# Patient Record
Sex: Female | Born: 1935 | Race: White | Hispanic: No
Health system: Southern US, Community
[De-identification: ages and names within clinical notes are randomized; demographics above are authoritative.]

---

## 2003-11-16 ENCOUNTER — Ambulatory Visit: Payer: Self-pay | Admitting: Gastroenterology

## 2004-10-17 ENCOUNTER — Ambulatory Visit: Payer: Self-pay | Admitting: Internal Medicine

## 2005-11-08 ENCOUNTER — Ambulatory Visit: Payer: Self-pay | Admitting: Internal Medicine

## 2006-10-08 ENCOUNTER — Other Ambulatory Visit: Payer: Self-pay

## 2006-10-08 ENCOUNTER — Ambulatory Visit: Payer: Self-pay | Admitting: Unknown Physician Specialty

## 2006-10-22 ENCOUNTER — Ambulatory Visit: Payer: Self-pay | Admitting: Unknown Physician Specialty

## 2006-11-12 ENCOUNTER — Ambulatory Visit: Payer: Self-pay | Admitting: Internal Medicine

## 2006-11-19 ENCOUNTER — Ambulatory Visit: Payer: Self-pay | Admitting: Internal Medicine

## 2007-12-09 ENCOUNTER — Ambulatory Visit: Payer: Self-pay | Admitting: Internal Medicine

## 2008-01-15 ENCOUNTER — Ambulatory Visit: Payer: Self-pay | Admitting: Internal Medicine

## 2008-02-10 ENCOUNTER — Ambulatory Visit: Payer: Self-pay | Admitting: Internal Medicine

## 2008-04-08 ENCOUNTER — Ambulatory Visit: Payer: Self-pay | Admitting: Specialist

## 2008-08-10 ENCOUNTER — Ambulatory Visit: Payer: Self-pay | Admitting: Internal Medicine

## 2008-09-16 ENCOUNTER — Encounter: Payer: Self-pay | Admitting: Neurology

## 2008-10-13 ENCOUNTER — Encounter: Payer: Self-pay | Admitting: Neurology

## 2008-11-12 ENCOUNTER — Encounter: Payer: Self-pay | Admitting: Neurology

## 2008-12-13 ENCOUNTER — Encounter: Payer: Self-pay | Admitting: Neurology

## 2009-01-17 ENCOUNTER — Ambulatory Visit: Payer: Self-pay | Admitting: Internal Medicine

## 2009-01-19 ENCOUNTER — Encounter: Payer: Self-pay | Admitting: Neurology

## 2009-02-12 ENCOUNTER — Encounter: Payer: Self-pay | Admitting: Neurology

## 2009-03-15 ENCOUNTER — Encounter: Payer: Self-pay | Admitting: Neurology

## 2009-04-12 ENCOUNTER — Encounter: Payer: Self-pay | Admitting: Neurology

## 2009-05-13 ENCOUNTER — Encounter: Payer: Self-pay | Admitting: Neurology

## 2009-06-12 ENCOUNTER — Encounter: Payer: Self-pay | Admitting: Neurology

## 2009-06-17 ENCOUNTER — Emergency Department: Payer: Self-pay | Admitting: Emergency Medicine

## 2010-01-18 ENCOUNTER — Ambulatory Visit: Payer: Self-pay | Admitting: Internal Medicine

## 2010-04-21 ENCOUNTER — Ambulatory Visit: Payer: Self-pay | Admitting: Ophthalmology

## 2010-04-21 DIAGNOSIS — I119 Hypertensive heart disease without heart failure: Secondary | ICD-10-CM

## 2010-05-01 ENCOUNTER — Ambulatory Visit: Payer: Self-pay | Admitting: Ophthalmology

## 2011-02-20 ENCOUNTER — Emergency Department: Payer: Self-pay | Admitting: Emergency Medicine

## 2011-07-20 ENCOUNTER — Emergency Department: Payer: Self-pay | Admitting: Emergency Medicine

## 2011-07-20 LAB — URINALYSIS, COMPLETE
Blood: NEGATIVE
Glucose,UR: NEGATIVE mg/dL (ref 0–75)
Leukocyte Esterase: NEGATIVE
RBC,UR: 1 /HPF (ref 0–5)
Squamous Epithelial: <1
WBC UR: 1 /HPF (ref 0–5)

## 2011-07-20 LAB — COMPREHENSIVE METABOLIC PANEL
Albumin: 3.9 g/dL (ref 3.4–5.0)
Alkaline Phosphatase: 73 U/L (ref 50–136)
Anion Gap: 9 (ref 7–16)
BUN: 11 mg/dL (ref 7–18)
Bilirubin,Total: 0.7 mg/dL (ref 0.2–1.0)
Calcium, Total: 8.9 mg/dL (ref 8.5–10.1)
Chloride: 108 mmol/L — ABNORMAL HIGH (ref 98–107)
Co2: 25 mmol/L (ref 21–32)
Creatinine: 0.73 mg/dL (ref 0.60–1.30)
EGFR (African American): >60
Glucose: 101 mg/dL — ABNORMAL HIGH (ref 65–99)
Osmolality: 283 (ref 275–301)
Potassium: 3.6 mmol/L (ref 3.5–5.1)
SGOT(AST): 78 U/L — ABNORMAL HIGH (ref 15–37)
SGPT (ALT): 91 U/L — ABNORMAL HIGH
Sodium: 142 mmol/L (ref 136–145)
Total Protein: 7.9 g/dL (ref 6.4–8.2)

## 2011-07-20 LAB — CBC
HCT: 37.6 % (ref 35.0–47.0)
MCH: 35 pg — ABNORMAL HIGH (ref 26.0–34.0)
MCHC: 33.6 g/dL (ref 32.0–36.0)
MCV: 104 fL — ABNORMAL HIGH (ref 80–100)
Platelet: 204 10*3/uL (ref 150–440)
WBC: 7.7 10*3/uL (ref 3.6–11.0)

## 2011-07-20 LAB — DRUG SCREEN, URINE
Barbiturates, Ur Screen: NEGATIVE (ref ?–200)
Cannabinoid 50 Ng, Ur ~~LOC~~: NEGATIVE (ref ?–50)
Cocaine Metabolite,Ur ~~LOC~~: NEGATIVE (ref ?–300)
MDMA (Ecstasy)Ur Screen: NEGATIVE (ref ?–500)
Methadone, Ur Screen: NEGATIVE (ref ?–300)
Phencyclidine (PCP) Ur S: NEGATIVE (ref ?–25)

## 2011-07-23 ENCOUNTER — Encounter: Payer: Self-pay | Admitting: Internal Medicine

## 2011-08-13 ENCOUNTER — Encounter: Payer: Self-pay | Admitting: Internal Medicine

## 2012-04-24 ENCOUNTER — Encounter: Payer: Self-pay | Admitting: Internal Medicine

## 2012-06-27 ENCOUNTER — Ambulatory Visit: Payer: Self-pay

## 2012-08-12 ENCOUNTER — Ambulatory Visit: Payer: Self-pay | Admitting: Internal Medicine

## 2012-09-01 ENCOUNTER — Inpatient Hospital Stay: Payer: Self-pay | Admitting: Internal Medicine

## 2012-09-01 LAB — COMPREHENSIVE METABOLIC PANEL
Albumin: 3 g/dL — ABNORMAL LOW (ref 3.4–5.0)
Alkaline Phosphatase: 87 U/L (ref 50–136)
Anion Gap: 5 — ABNORMAL LOW (ref 7–16)
Bilirubin,Total: 1 mg/dL (ref 0.2–1.0)
Calcium, Total: 9.2 mg/dL (ref 8.5–10.1)
EGFR (African American): >60
EGFR (Non-African Amer.): >60
Osmolality: 284 (ref 275–301)
Potassium: 4.1 mmol/L (ref 3.5–5.1)
SGOT(AST): 31 U/L (ref 15–37)
SGPT (ALT): 18 U/L (ref 12–78)
Sodium: 140 mmol/L (ref 136–145)

## 2012-09-01 LAB — DRUG SCREEN, URINE
Amphetamines, Ur Screen: NEGATIVE (ref ?–1000)
Barbiturates, Ur Screen: NEGATIVE (ref ?–200)
Benzodiazepine, Ur Scrn: NEGATIVE (ref ?–200)
Methadone, Ur Screen: NEGATIVE (ref ?–300)
Opiate, Ur Screen: NEGATIVE (ref ?–300)
Tricyclic, Ur Screen: NEGATIVE (ref ?–1000)

## 2012-09-01 LAB — CBC
HCT: 34.2 % — ABNORMAL LOW (ref 35.0–47.0)
Platelet: 187 10*3/uL (ref 150–440)
RBC: 3.36 10*6/uL — ABNORMAL LOW (ref 3.80–5.20)
RDW: 12.6 % (ref 11.5–14.5)
WBC: 8.6 10*3/uL (ref 3.6–11.0)

## 2012-09-01 LAB — URINALYSIS, COMPLETE
Glucose,UR: NEGATIVE mg/dL (ref 0–75)
Ph: 6 (ref 4.5–8.0)
RBC,UR: 4 /HPF (ref 0–5)
Specific Gravity: 1.017 (ref 1.003–1.030)
Squamous Epithelial: NONE SEEN
WBC UR: 567 /HPF (ref 0–5)

## 2012-09-02 LAB — CBC WITH DIFFERENTIAL/PLATELET
Basophil %: 0.9 %
Eosinophil %: 2.9 %
MCH: 34.9 pg — ABNORMAL HIGH (ref 26.0–34.0)
MCHC: 34.1 g/dL (ref 32.0–36.0)
MCV: 102 fL — ABNORMAL HIGH (ref 80–100)
Monocyte %: 12.3 %
Neutrophil %: 68.2 %
Platelet: 191 10*3/uL (ref 150–440)
RBC: 3 10*6/uL — ABNORMAL LOW (ref 3.80–5.20)

## 2012-09-02 LAB — BASIC METABOLIC PANEL
Co2: 27 mmol/L (ref 21–32)
EGFR (African American): >60
EGFR (Non-African Amer.): >60
Osmolality: 281 (ref 275–301)
Potassium: 4 mmol/L (ref 3.5–5.1)
Sodium: 140 mmol/L (ref 136–145)

## 2012-09-03 LAB — CBC WITH DIFFERENTIAL/PLATELET
Eosinophil #: 0.3 10*3/uL (ref 0.0–0.7)
HCT: 29.9 % — ABNORMAL LOW (ref 35.0–47.0)
HGB: 10.3 g/dL — ABNORMAL LOW (ref 12.0–16.0)
Lymphocyte #: 1.5 10*3/uL (ref 1.0–3.6)
MCV: 101 fL — ABNORMAL HIGH (ref 80–100)
Monocyte #: 1 x10 3/mm — ABNORMAL HIGH (ref 0.2–0.9)
Monocyte %: 14.5 %
Neutrophil %: 57.7 %
Platelet: 208 10*3/uL (ref 150–440)
WBC: 6.7 10*3/uL (ref 3.6–11.0)

## 2012-09-03 LAB — BASIC METABOLIC PANEL
Anion Gap: 4 — ABNORMAL LOW (ref 7–16)
BUN: 13 mg/dL (ref 7–18)
Chloride: 108 mmol/L — ABNORMAL HIGH (ref 98–107)
EGFR (African American): >60
EGFR (Non-African Amer.): >60
Glucose: 90 mg/dL (ref 65–99)
Osmolality: 281 (ref 275–301)
Potassium: 3.5 mmol/L (ref 3.5–5.1)
Sodium: 141 mmol/L (ref 136–145)

## 2012-09-03 LAB — URINE CULTURE

## 2012-09-04 LAB — BASIC METABOLIC PANEL
Anion Gap: 8 (ref 7–16)
BUN: 10 mg/dL (ref 7–18)
Calcium, Total: 9.2 mg/dL (ref 8.5–10.1)
Creatinine: 0.6 mg/dL (ref 0.60–1.30)
EGFR (Non-African Amer.): >60
Sodium: 139 mmol/L (ref 136–145)

## 2012-09-04 LAB — CBC WITH DIFFERENTIAL/PLATELET
Basophil #: 0.1 10*3/uL (ref 0.0–0.1)
Basophil %: 0.7 %
Eosinophil #: 0.3 10*3/uL (ref 0.0–0.7)
HCT: 31.7 % — ABNORMAL LOW (ref 35.0–47.0)
MCH: 32.5 pg (ref 26.0–34.0)
MCV: 100 fL (ref 80–100)
Monocyte %: 11.5 %
Neutrophil #: 4.4 10*3/uL (ref 1.4–6.5)
Neutrophil %: 60.2 %
Platelet: 244 10*3/uL (ref 150–440)
RBC: 3.17 10*6/uL — ABNORMAL LOW (ref 3.80–5.20)
RDW: 13 % (ref 11.5–14.5)

## 2012-09-05 ENCOUNTER — Encounter: Payer: Self-pay | Admitting: Internal Medicine

## 2012-09-06 LAB — CULTURE, BLOOD (SINGLE)

## 2012-09-12 ENCOUNTER — Encounter: Payer: Self-pay | Admitting: Internal Medicine

## 2012-09-12 ENCOUNTER — Ambulatory Visit: Payer: Self-pay | Admitting: Nurse Practitioner

## 2012-09-23 LAB — URINALYSIS, COMPLETE
Glucose,UR: NEGATIVE mg/dL (ref 0–75)
Ketone: NEGATIVE
Nitrite: POSITIVE
Ph: 5 (ref 4.5–8.0)
RBC,UR: 23 /HPF (ref 0–5)
Specific Gravity: 1.015 (ref 1.003–1.030)
Squamous Epithelial: 2
WBC UR: 924 /HPF (ref 0–5)

## 2012-09-26 LAB — URINE CULTURE

## 2013-06-12 ENCOUNTER — Encounter: Payer: Self-pay | Admitting: Internal Medicine

## 2013-06-12 ENCOUNTER — Ambulatory Visit: Payer: Self-pay | Admitting: Nurse Practitioner

## 2013-06-20 LAB — URINALYSIS, COMPLETE
Bilirubin,UR: NEGATIVE
Glucose,UR: NEGATIVE mg/dL (ref 0–75)
KETONE: NEGATIVE
NITRITE: POSITIVE
PH: 5 (ref 4.5–8.0)
Protein: 100
RBC,UR: 44 /HPF (ref 0–5)
Specific Gravity: 1.018 (ref 1.003–1.030)
Squamous Epithelial: NONE SEEN
WBC UR: 3968 /HPF (ref 0–5)

## 2013-06-22 LAB — URINE CULTURE

## 2013-07-06 LAB — URINALYSIS, COMPLETE: Specific Gravity: 1.018 (ref 1.003–1.030)

## 2013-07-08 LAB — URINE CULTURE

## 2013-07-13 ENCOUNTER — Encounter: Payer: Self-pay | Admitting: Internal Medicine

## 2013-07-13 ENCOUNTER — Ambulatory Visit
Admit: 2013-07-13 | Disposition: A | Discharge status: Home / Self Care | Payer: Self-pay | Attending: Nurse Practitioner | Admitting: Nurse Practitioner

## 2013-08-02 LAB — URINALYSIS, COMPLETE
Bilirubin,UR: NEGATIVE
Glucose,UR: NEGATIVE mg/dL (ref 0–75)
Hyaline Cast: 18
Ketone: NEGATIVE
Nitrite: POSITIVE
Ph: 5 (ref 4.5–8.0)
Protein: 100
RBC,UR: 212 /HPF (ref 0–5)
Specific Gravity: 1.016 (ref 1.003–1.030)
Squamous Epithelial: 4
WBC UR: 3209 /HPF (ref 0–5)

## 2013-08-05 LAB — URINE CULTURE

## 2013-08-12 ENCOUNTER — Encounter: Payer: Self-pay | Admitting: Internal Medicine

## 2013-08-19 LAB — URINALYSIS, COMPLETE
Bilirubin,UR: NEGATIVE
Glucose,UR: NEGATIVE mg/dL (ref 0–75)
KETONE: NEGATIVE
Nitrite: POSITIVE
PH: 6 (ref 4.5–8.0)
Protein: 30
RBC,UR: 87 /HPF (ref 0–5)
Specific Gravity: 1.013 (ref 1.003–1.030)
WBC UR: 1298 /HPF (ref 0–5)

## 2013-08-23 LAB — URINE CULTURE

## 2013-09-12 ENCOUNTER — Encounter: Payer: Self-pay | Admitting: Internal Medicine

## 2013-09-17 LAB — URINALYSIS, COMPLETE
Bilirubin,UR: NEGATIVE
Glucose,UR: NEGATIVE mg/dL (ref 0–75)
KETONE: NEGATIVE
Nitrite: POSITIVE
PH: 8 (ref 4.5–8.0)
RBC,UR: 55 /HPF (ref 0–5)
SPECIFIC GRAVITY: 1.014 (ref 1.003–1.030)
Squamous Epithelial: <1
WBC UR: 498 /HPF (ref 0–5)

## 2013-09-20 LAB — URINE CULTURE

## 2013-10-01 LAB — URINALYSIS, COMPLETE
Bilirubin,UR: NEGATIVE
Blood: NEGATIVE
GLUCOSE, UR: NEGATIVE mg/dL (ref 0–75)
KETONE: NEGATIVE
Nitrite: POSITIVE
Ph: 7 (ref 4.5–8.0)
RBC,UR: 42 /HPF (ref 0–5)
SPECIFIC GRAVITY: 1.021 (ref 1.003–1.030)

## 2013-10-03 LAB — URINE CULTURE

## 2013-10-13 ENCOUNTER — Encounter: Payer: Self-pay | Admitting: Internal Medicine

## 2013-10-23 LAB — URINALYSIS, COMPLETE
Bilirubin,UR: NEGATIVE
GLUCOSE, UR: NEGATIVE mg/dL (ref 0–75)
Ketone: NEGATIVE
NITRITE: POSITIVE
PH: 7 (ref 4.5–8.0)
Specific Gravity: 1.021 (ref 1.003–1.030)
Squamous Epithelial: 2
WBC UR: 1452 /HPF (ref 0–5)

## 2013-10-26 LAB — URINE CULTURE

## 2013-11-12 ENCOUNTER — Encounter: Payer: Self-pay | Admitting: Internal Medicine

## 2013-11-16 LAB — URINALYSIS, COMPLETE
BILIRUBIN, UR: NEGATIVE
BLOOD: NEGATIVE
Bacteria: NONE SEEN
Glucose,UR: NEGATIVE mg/dL (ref 0–75)
KETONE: NEGATIVE
Nitrite: POSITIVE
PH: 6 (ref 4.5–8.0)
PROTEIN: NEGATIVE
Specific Gravity: 1.015 (ref 1.003–1.030)
Squamous Epithelial: 1

## 2013-11-18 LAB — URINE CULTURE

## 2013-12-13 ENCOUNTER — Encounter: Payer: Self-pay | Admitting: Internal Medicine

## 2014-01-12 ENCOUNTER — Encounter: Payer: Self-pay | Admitting: Internal Medicine

## 2014-02-12 ENCOUNTER — Encounter: Payer: Self-pay | Admitting: Internal Medicine

## 2014-03-15 ENCOUNTER — Encounter: Payer: Self-pay | Admitting: Internal Medicine

## 2014-04-13 ENCOUNTER — Encounter
Admit: 2014-04-13 | Disposition: A | Discharge status: Home / Self Care | Payer: Self-pay | Attending: Internal Medicine | Admitting: Internal Medicine

## 2014-05-14 ENCOUNTER — Encounter
Admit: 2014-05-14 | Disposition: A | Discharge status: Home / Self Care | Payer: Self-pay | Attending: Internal Medicine | Admitting: Internal Medicine

## 2014-06-04 NOTE — Consult Note (Signed)
Brief Consult Note: Diagnosis: Non displaced left lateral malleolus fracture.   Patient was seen by consultant.   Recommend further assessment or treatment.   Orders entered.   Comments: 81 year old female fell twice in the last few days [facility] inj[facility] the left ankle.  Admitted for medical problems, but X-rays show a hairline fracture of the left lateral malleolus. Splint has been applied.  She denies much pain.    Exam:  Alert but difficulty with speech due to ALS.  circulation/sensation/motor function good left foot and no swelling.  Splint in place.  Hips, knees, and right ankle normal.    X-rays: hairline fracture left lateral malleolus  Rx:  Will apply short leg cast tomorrow.          out of bed in chair         Physical Therapy partial weight bearing left.  Electronic Signatures: Valinda HoarMiller, Zekiah Caruth E (MD)  (Signed 21-Jul-14 17:11)  Authored: Brief Consult Note   Last Updated: 21-Jul-14 17:11 by Valinda HoarMiller, Gregary Blackard E (MD)

## 2014-06-04 NOTE — H&P (Signed)
PATIENT NAME:  Emily Acosta, FRITCHER MR#:  [id number] DATE OF BIRTH:  05/24/1932  DATE OF ADMISSION:  09/01/2012  PRIMARY CARE PROVIDER: Aram Beecham, MD  ED REFERRING PHYSICIAN: Minna Antis, MD  CHIEF COMPLAINT: Status post fall x 2, some altered mental status, fevers.   HISTORY OF PRESENT ILLNESS: The patient is a 81 year old white female with history of GERD, Sjogren syndrome, hypertension, and ALS who currently resides in independent apartment living with 24 hour aide by her side who has had history of recurrent UTIs. According to the patient, her last UTI was in March. She started to have fever about 3 days ago of 101, and she started also having pain in both of her flank regions. The patient also uses walker and usually takes about 10 steps to get into the chair with assistance. She sort of slumped down on Friday and then Sunday she had another episode in the bathroom where she fell. The patient was having left lower leg pain and ankle pain. She otherwise complains of burning with urination and denies any frequency or urgency. She does complain of shortness of breath when she states that she talks a lot. She does have some issues with swallowing, but she has been able to eat chopped regular food, sometimes soft mostly. She also has chronic mouth dryness and dry eyes. She otherwise denies any chest pains or palpitations. She also has intermittent constipation and diarrhea. She denies any blood in the urine, denies any hematemesis or hematochezia.   PAST MEDICAL HISTORY: Significant for:  1.  GERD.  2.  Sjogren syndrome.  3.  Hypertension.  4.  ALS.   PAST SURGICAL HISTORY:  1.  Cholecystectomy. 2.  Bilateral oophorectomy.   ALLERGIES: CIPRO, FLAGYL, PENICILLIN AND SULFA DRUGS.   CURRENT MEDICATIONS: 1.  Acetaminophen 500 q. 4 to 6 p.r.n. for pain. 2.  Acetaminophen/hydrocodone 5/500 one to two  tabs q. 4 to q. 6 p.r.n. for pain. 3.  Aleve 220 q. 8 p.r.n. 4.  Alprazolam 0.25 one tab  p.o. daily as needed. 5.  Amlodipine 5 daily. 6.  Baclofen 10 mg 2 tabs in the morning and 1 tab in the afternoon and 2 tabs at bedtime. 7.  Cranberry oral caps daily. 8.  Fluoxetine 150 as needed for candidiasis. 9.  Ketoconazole topically 2% topical cream applied to effected area of face b.i.d. 10.  Loperamide 2 mg as needed for diarrhea. 11.  Losartan 100 daily. 12.  Meclizine 12.5 mg 1 tab p.o. t.i.d. as needed. 13.  Omeprazole 20 daily. 14.  Pilocarpine 5 mg 1 tab 5 times a day. 15.  Prochlorperazine 10 mg 1 tab p.o. q. 4 to 6 p.r.n. for nausea and vomiting.  16.  Prochlorperazine 25 rectal suppositories q. 4 to q. 6 p.r.n. for nausea and vomiting. 17.  Refresh eyedrops 1 drop to affected eye b.i.d. as needed. 18.  Riluzole  50 mg 1 tab p.o. b.i.d.  19.  Senna 1 tab p.o. 2 to 4 times a day as needed for constipation. 20.  Triamcinolone topically 0.5% cream to effected area around the mouth 2 times a day. 21.  Ambien 5 mg at bedtime. 22.  Zyrtec 10 daily.   SOCIAL HISTORY: Does not smoke. Does not drink. Does not do any drugs. Is currently followed by home hospice.   FAMILY HISTORY: Father had lung cancer. Mother had heart problems.   REVIEW OF SYSTEMS: Are limited due to the patient's ability to communicate completely, but she is able  to answer some questions.  CONSTITUTIONAL: Complains of fevers, fatigue, weakness, left ankle and leg pain. No significant weight loss or weight gain.  EYES: Has chronic blurred vision and dry eyes. Denies any redness or inflammation.  ENT: No tinnitus. No ear pain. No hearing loss. No seasonal or year-round allergies. No epistaxis. Has chronic dry mouth. Has some difficulty with swallowing.  RESPIRATORY: Denies any cough, wheezing or hemoptysis. Has chronic dyspnea. Denies any painful respirations. No COPD. CARDIOVASCULAR: Denies any chest pain, orthopnea. She does have intermittent edema of the lower extremities. No arrhythmias. No syncope.   GASTROINTESTINAL: No nausea, vomiting, diarrhea. Denies any abdominal pain. No hematemesis. No melena. No ulcer. Has GERD.  GENITOURINARY:  Complains of dysuria.  No hematuria. Complains of bilateral flank pain.  ENDOCRINE:  Denies any polyuria, nocturia or thyroid problems.  HEMATOLOGIC/LYMPHATIC: Denies anemia, easy bruisability or bleeding.  SKIN: Denies any acne or rash.  MUSCULOSKELETAL: Has pain in the left ankle. No gout.  NEUROLOGICAL: No CVA. No TIA or seizures. Has a history of ALS since 2009.  PSYCHIATRIC: Is on medication for anxiety.   PHYSICAL EXAMINATION: VITAL SIGNS: Temperature 98.1, pulse 92, respirations 20, blood pressure 117/67, O2 90% on room air.  GENERAL: The patient is a chronically debilitated-appearing Caucasian female lying in bed. Currently not in any acute distress.  HEENT: Head atraumatic, normocephalic. Pupils equally round and reactive to light and accommodation. There is no conjunctival pallor. She does have dry eyes. Nasal exam shows no nasal lesions. No drainage. Ears externally shows no erythema or drainage. Mouth is dry without any exudates.  NECK: Supple and symmetric. No masses. Thyroid midline.  LUNGS:  Clear to auscultation bilaterally without any rales, rhonchi or wheezing. No accessory muscle usage.  HEART:  Regular rate and rhythm without murmurs, gallops, clicks or heaves.  ABDOMEN: Soft, nontender, nondistended. Positive bowel sounds x 4. No hepatosplenomegaly.  GENITOURINARY: Deferred.  MUSCULOSKELETAL: There is no erythema or swelling.  SKIN: There is no rash noted.  LYMPHATICS: No lymph nodes palpable.  VASCULAR: Good DP and PT pulses.  NEUROLOGICAL: The patient has cranial nerves II through XII grossly intact. DTRs are symmetric. Has global generalized weakness.  PSYCHIATRIC: Not anxious or depressed.   DIAGNOSTICS:  CT scan of the head shows no acute abnormality. Left tibia and fibula shows there is a septal cortical disruption of the  anterior cortex of the distal fibula consistent with nondisplaced fracture. Chest x-ray shows no acute abnormality.   LABORATORY DATA:  Urinalysis shows 3+ leukocytes, WBCs 567, bacteria 3+. TUDs were negative. WBC 8.6, hemoglobin 11.8, platelet count 187. Glucose 95, BUN 27, creatinine 0.87, sodium 140, potassium 4.1, chloride 109, CO2 26 and calcium was 9.2. LFTs were normal, except albumin of 3.  Troponin less than 0.02.   ASSESSMENT AND PLAN: The patient is a 81 year old with amyotrophic lateral sclerosis and recurrent UTIs who is brought in with 2 falls, fever and some changes in her mental status.  1.  Fever and altered mental status.  Likely due to urinary tract infection and acute encephalopathy as a result of urinary tract infection. At this time, we will treat her with IV ceftriaxone, follow urine cultures. Will also obtain blood cultures.  2.  Fall with left fibula fracture. At this time, we will place an ankle immobilizer in place. Ortho consult.  3.  Gastroesophageal reflux disease. We will continue omeprazole.  4.  Hypertension. We will continue amlodipine and losartan.  5.  Sjogren syndrome. Continue eyedrops and  Pilocarpine as taking at home.   CODE STATUS: The patient is DO NOT RESUSCITATE, followed by hospice.  TIME SPENT: 50 minutes was spent on the H and P.  ____________________________ Lacie ScottsShreyang H. Allena KatzPatel, MD shp:sb D: 09/01/2012 13:12:33 ET T: 09/01/2012 13:29:36 ET JOB#: [id number]  cc: Lilianah Buffin H. Allena KatzPatel, MD, <Dictator> Charise CarwinSHREYANG H Varonica Siharath MD ELECTRONICALLY SIGNED 09/15/2012 8:59

## 2014-06-04 NOTE — Discharge Summary (Signed)
PATIENT NAME:  Emily Acosta, Emily Acosta DATE OF BIRTH:  [birth year]  DATE OF ADMISSION:  09/01/2012 DATE OF DISCHARGE:  09/04/2012   TYPE OF DISCHARGE: The patient transferred to a skilled nursing facility.   REASON FOR ADMISSION: Altered mental status with fever and frequent falls.   HISTORY OF PRESENT ILLNESS: The patient is a 81 year old female with a history of recurrent UTIs, ALS, Sjogren syndrome and reflux, who was being followed by hospice at home. She developed worsening confusion and fevers and was falling frequently. She was brought to the Emergency Room, where she was found to have a left lateral malleolus fracture as well as a UTI and was admitted for further evaluation.   PAST MEDICAL HISTORY:  1. ALS.  2. GE reflux disease.  3. Sjogren syndrome. 4. Benign hypertension.  5. Recurrent UTIs.  6. Status post cholecystectomy.  7. Status post  bilateral oophorectomy.   MEDICATIONS ON ADMISSION: Please see admission note.   ALLERGIES: CIPRO, FLAGYL, PENICILLIN AND SULFA.   SOCIAL HISTORY: Negative for alcohol or tobacco abuse.   FAMILY HISTORY: Positive for coronary artery disease and lung cancer.   REVIEW OF SYSTEMS: As per admission note.   PHYSICAL EXAMINATION:  GENERAL: The patient was in no acute distress.  VITAL SIGNS: Stable, and she was afebrile.  HEENT: Unremarkable.  NECK: Supple without JVD.  LUNGS: Clear.  CARDIAC: Revealed regular rate and rhythm with normal S1, S2.  ABDOMEN: Soft and nontender.  EXTREMITIES: Without edema.  NEUROLOGIC: Revealed global generalized weakness with symmetric deep tendon reflexes and intact cranial nerves.   HOSPITAL COURSE: The patient was admitted with a UTI and left malleolus fracture. She was seen by orthopedics. Blood and urine cultures were sent. She was started on IV fluids with IV antibiotics. The patient's confusion improved with hydration and IV antibiotics. She became afebrile. She was seen by orthopedics, who  placed a cast on her foot. She complained so much about the cast that it was taken off, and a boot was placed. Placement was recommended, and the patient and family agreed. Her blood cultures returned negative. Urine culture was positive for E. coli. She was switched from IV to oral antibiotics, which she tolerated well. She is now transferred to the skilled nursing facility for further care and rehabilitation.   DISCHARGE DIAGNOSES:  1. Urinary tract infection.  2. Left malleolus fracture.  3. Amyotrophic lateral sclerosis.  4. Benign hypertension.  5. Anemia of chronic disease.  6. Sjogren syndrome.  7. Gastroesophageal reflux disease.   DISCHARGE MEDICATIONS:  1. Xanax 0.25 mg p.o. q.h.s.  2. Norvasc 5 mg p.o. daily.  3. Baclofen 20 mg p.o. t.i.d.  4. Zyrtec 10 mg p.o. daily.  5. Losartan 100 mg p.o. daily.  6. Meclizine 12.5 mg p.o. t.i.d. p.r.n. vertigo.  7. Naproxen 250 mg p.o. q.8 hours p.r.n. pain.  8. Riluzole 50 mg p.o. b.i.d.  9. Omeprazole 20 mg p.o. daily.  10. Salagen 5 mg p.o. 5 times per day.  11. Compazine 10 mg p.o. q.6 hours p.r.n. nausea and vomiting.  12. Ambien 5 mg p.o. q.h.s. p.r.n. sleep.  13. Ceftin 250 mg p.o. b.i.d. for 10 days.  14. Oxygen at 2 liters per minute per nasal cannula.   FOLLOWUP PLANS AND APPOINTMENTS: The patient will be followed by the resident physician at the skilled nursing facility. She is on a pureed diet. She is a no code blue, do not resuscitate. She will be seen in consultation by  physical therapy.   ____________________________ Duane Lope. Judithann Sheen, MD jds:OSi D: 09/04/2012 07:41:07 ET T: 09/04/2012 07:47:43 ET JOB#: [id number]  cc: Duane Lope. Judithann Sheen, MD, <Dictator> Mearl Harewood Rodena Medin MD ELECTRONICALLY SIGNED 09/04/2012 10:35

## 2014-07-14 ENCOUNTER — Encounter
Admission: RE | Admit: 2014-07-14 | Discharge: 2014-07-14 | Disposition: A | Discharge status: Home / Self Care | Source: Ambulatory Visit | Attending: Internal Medicine | Admitting: Internal Medicine

## 2014-07-18 IMAGING — CT CT HEAD WITHOUT CONTRAST
1 series · 16 of 29 positions shown, 20 images · non-contrast
Comparison: none

REASON FOR EXAM: AMS, recent fall
COMMENTS:   May transport without cardiac monitor

PROCEDURE:     CT  - CT HEAD WITHOUT CONTRAST  - September 01, 2012 [DATE]
RESULT:     Comparison:  None
TECHNIQUE: Multiple axial images from the foramen magnum to the vertex were
obtained without IV contrast.

[Series 2: soft tissue · axial · 0.40mm/px · z∈[-68,+62]mm · 16 of 29 slices shown, 20 images]
[im 2/29  brain]
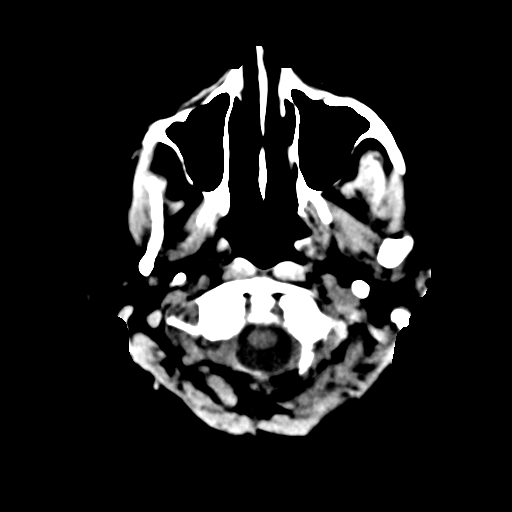
[im 2/29  bone]
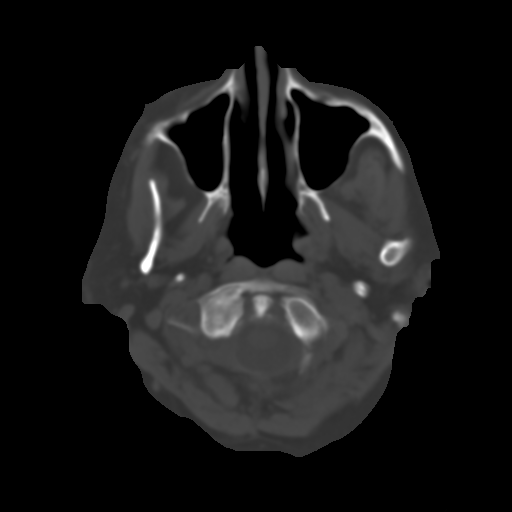
[im 4/29  brain]
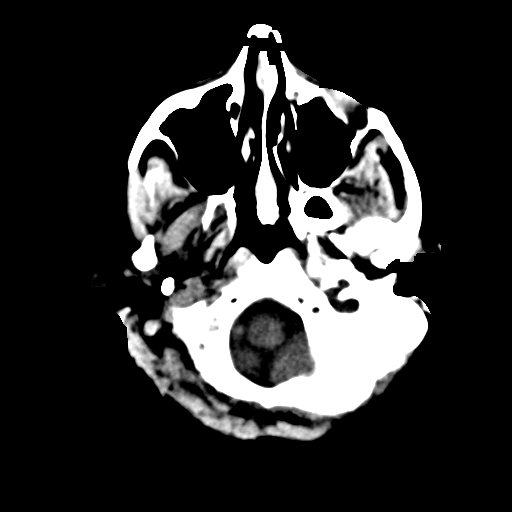
[im 6/29  brain]
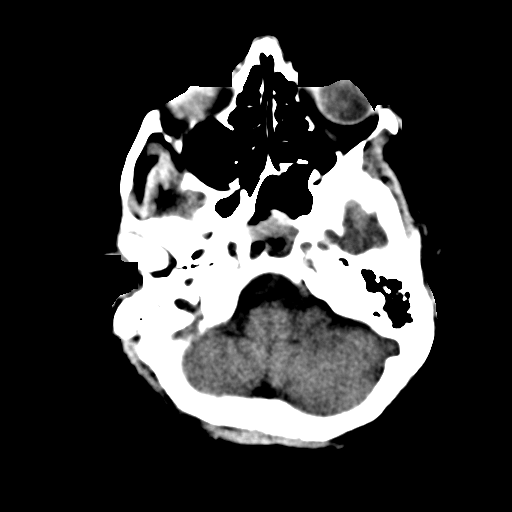
[im 7/29  brain]
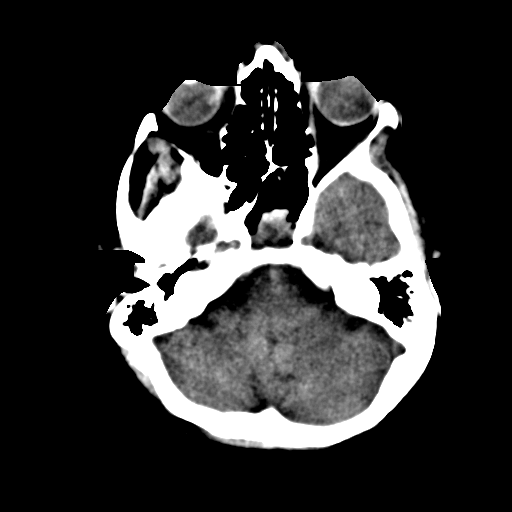
[im 9/29  brain]
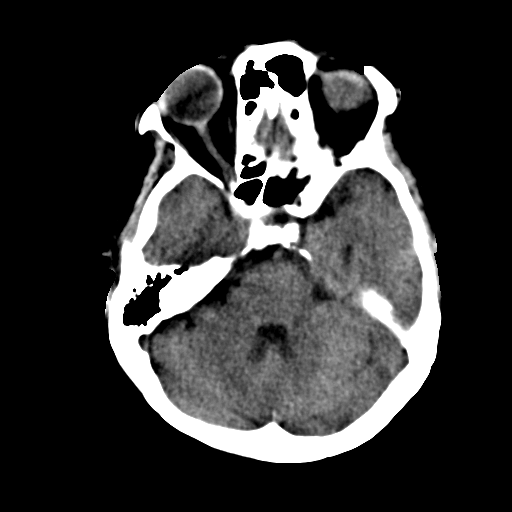
[im 9/29  bone]
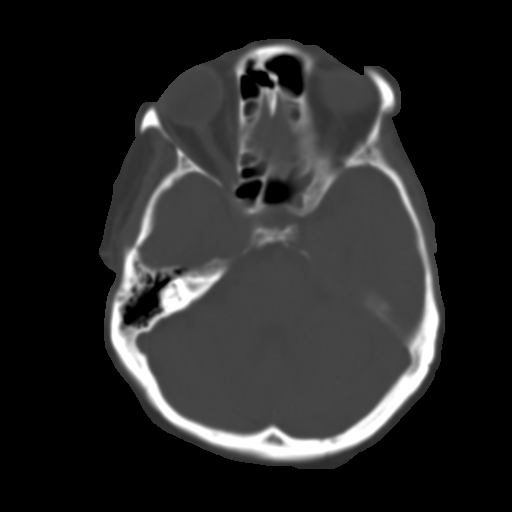
[im 11/29  brain]
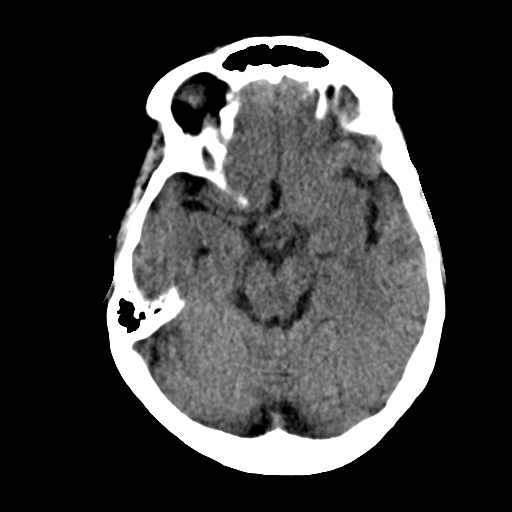
[im 12/29  brain]
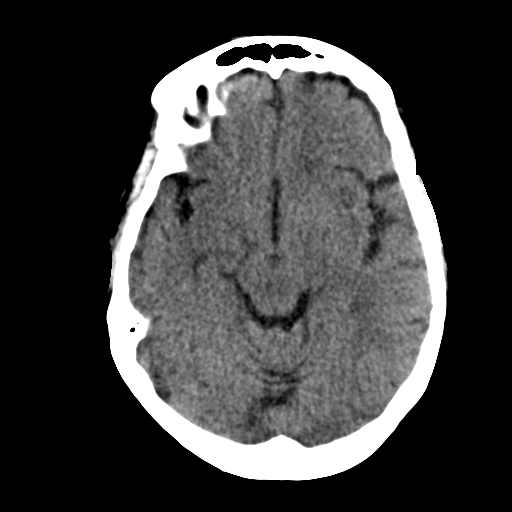
[im 14/29  brain]
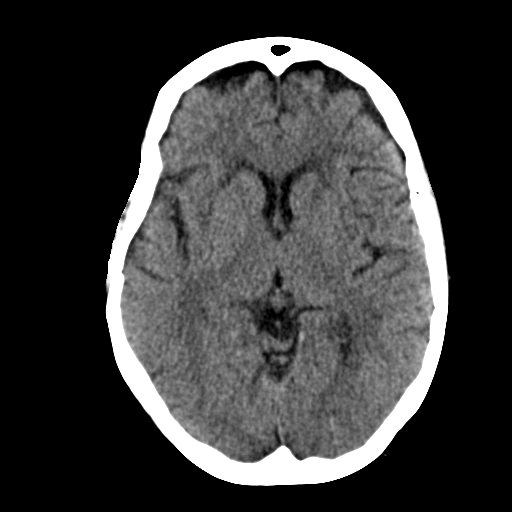
[im 16/29  brain]
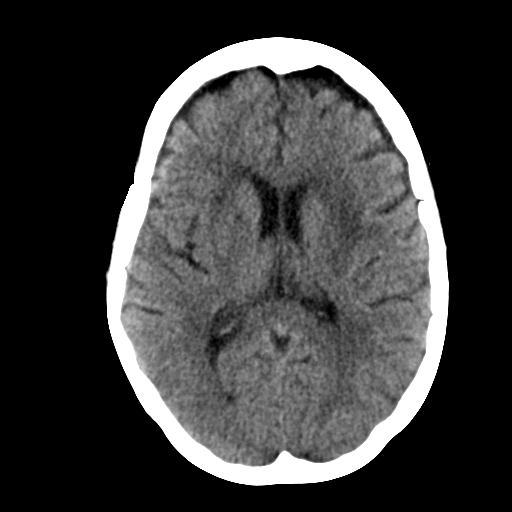
[im 16/29  bone]
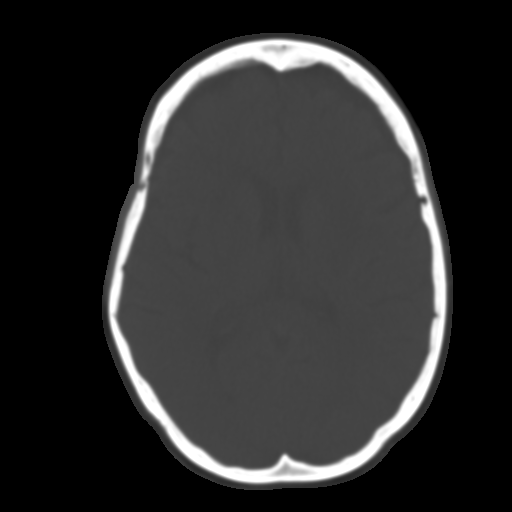
[im 18/29  brain]
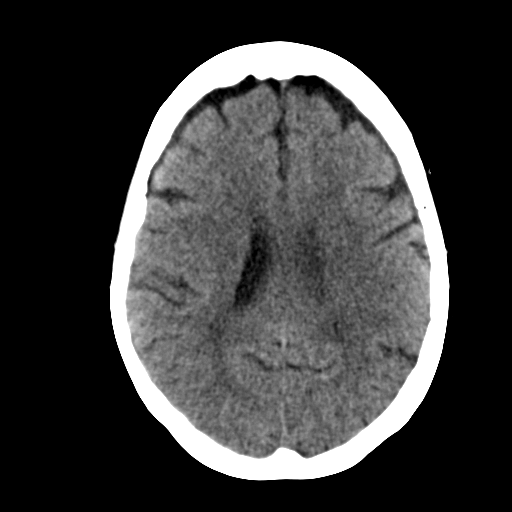
[im 19/29  brain]
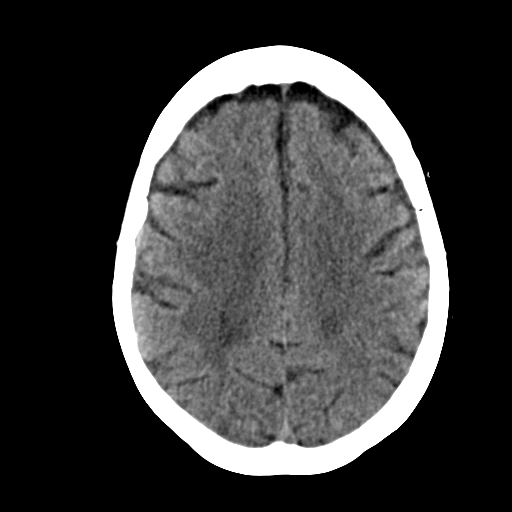
[im 21/29  brain]
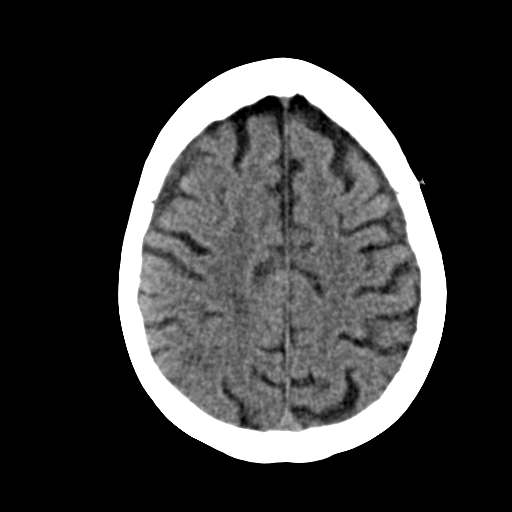
[im 23/29  brain]
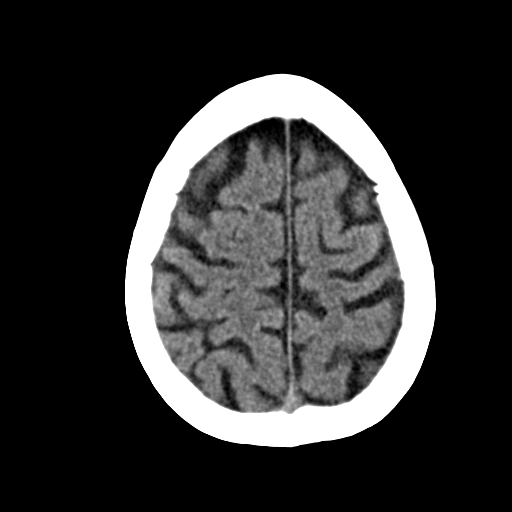
[im 23/29  bone]
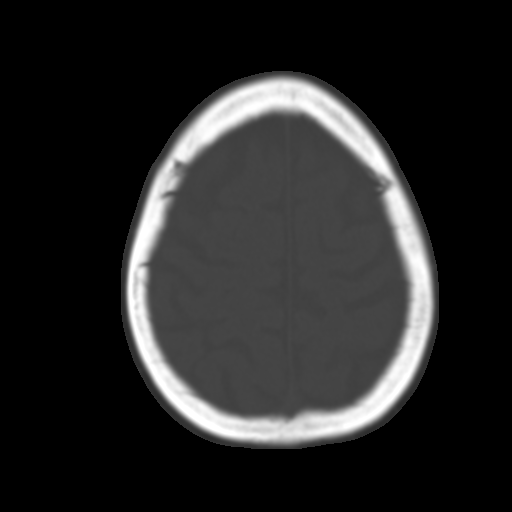
[im 24/29  brain]
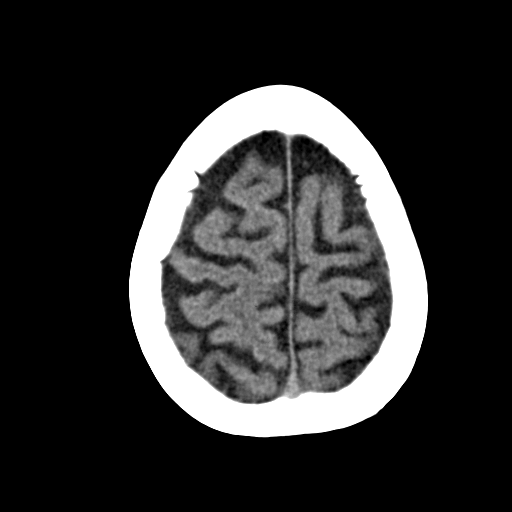
[im 26/29  brain]
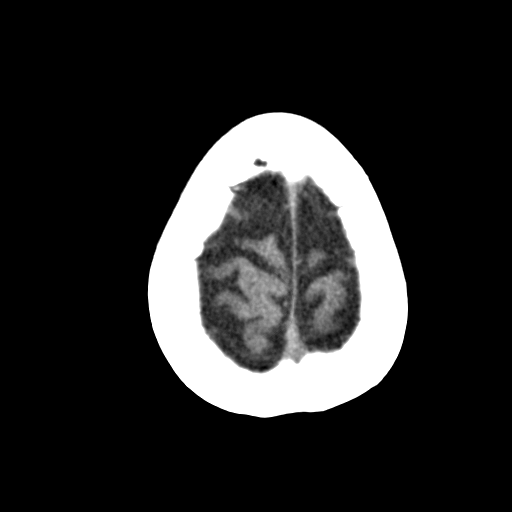
[im 28/29  brain]
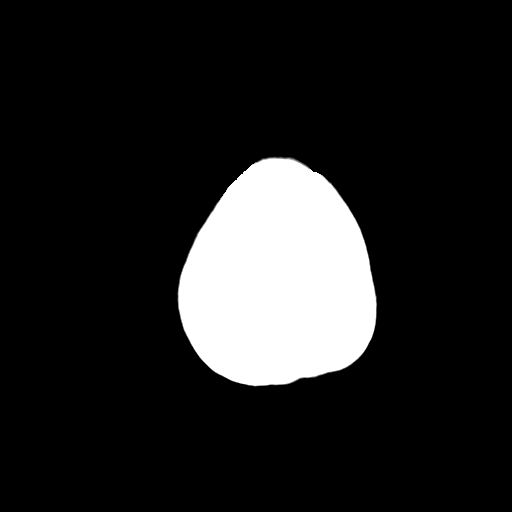

[16 of 29 positions shown; findings below may reference images not displayed]

FINDINGS: There is no evidence of mass effect, midline shift, or extra-axial fluid
collections.  There is no evidence of a space-occupying lesion or
intracranial hemorrhage. There is no evidence of a cortical-based area of
acute infarction. There is generalized cerebral atrophy. There is
periventricular white matter low attenuation likely secondary to
microangiopathy.

The ventricles and sulci are appropriate for the patient's age. The basal
cisterns are patent.

Visualized portions of the orbits are unremarkable. There is sphenoid sinus
the coastal thickening.

The osseous structures are unremarkable.
IMPRESSION: No acute intracranial process.

[REDACTED]

## 2014-07-18 IMAGING — CR DG TIBIA/FIBULA 2V*L*
1 series · 2 of 2 positions shown · non-contrast
Comparison: none

REASON FOR EXAM: fall, pain
COMMENTS:

[Series 1: ap · 0.17mm/px · 2 of 2 slices shown]
[im 1/2]
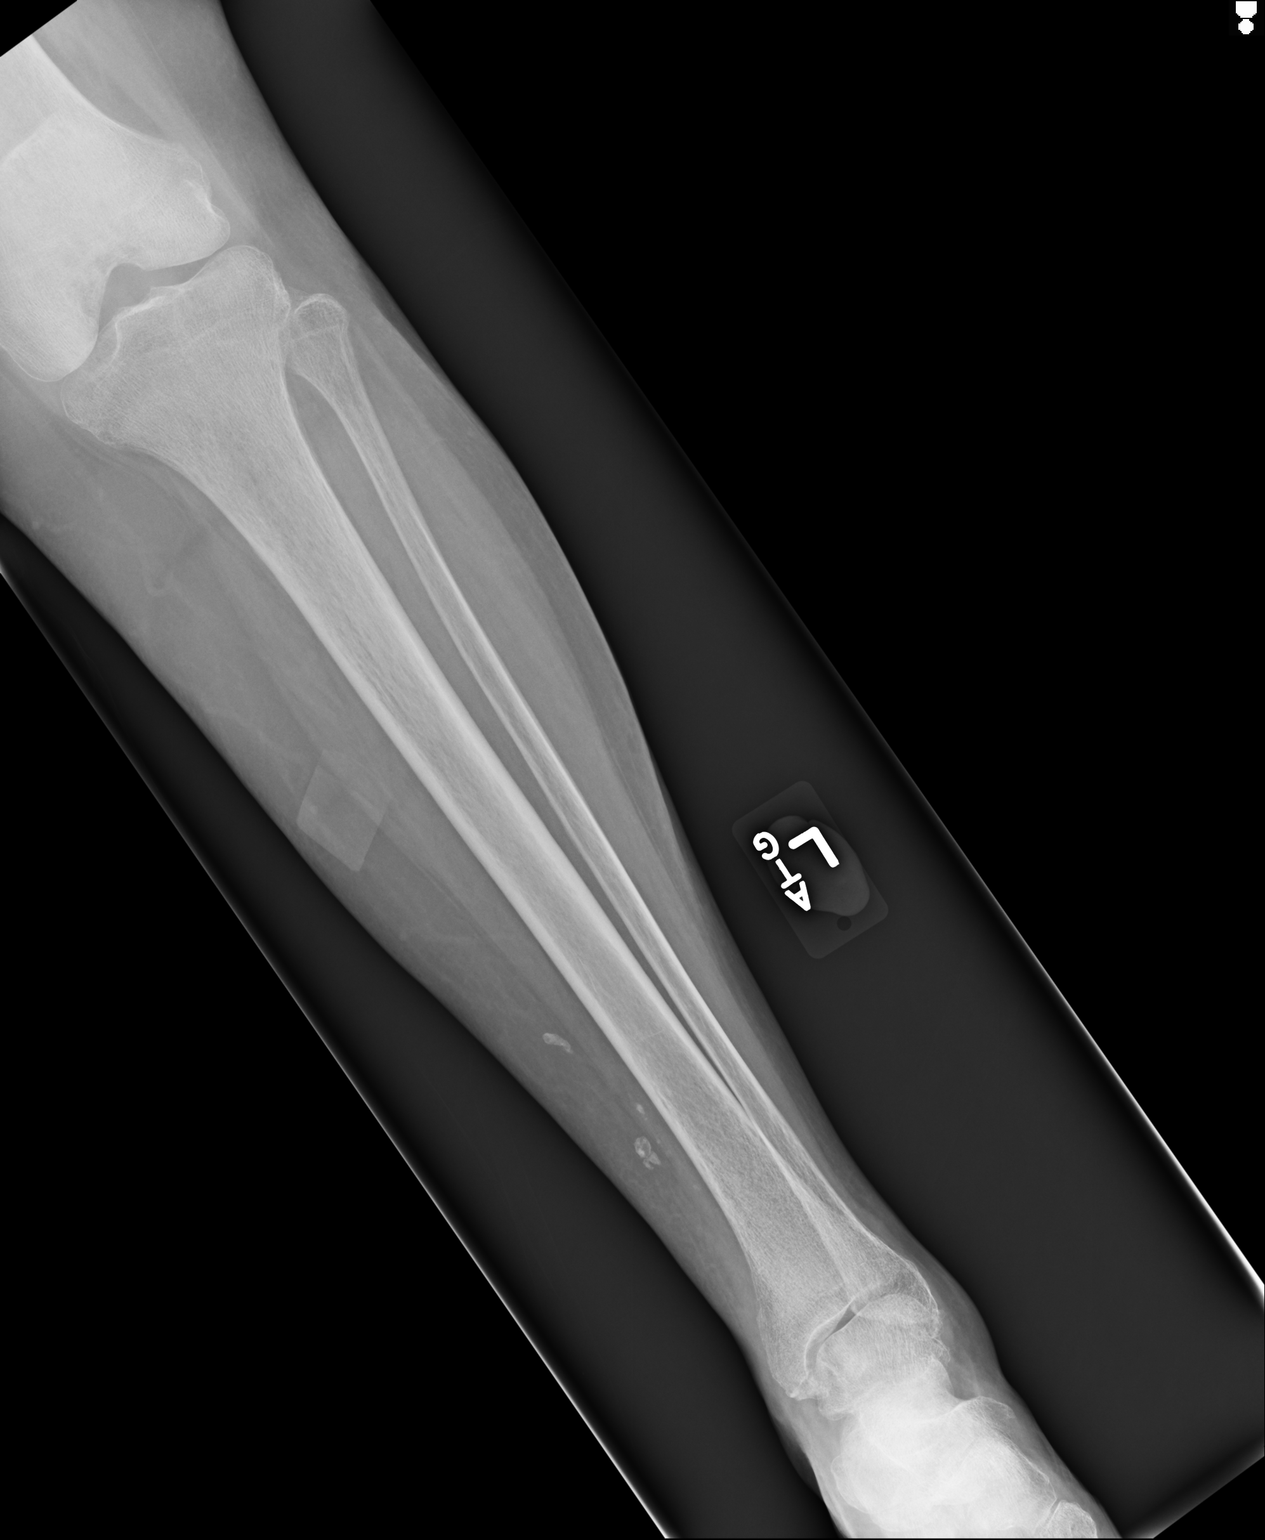
[im 2/2]
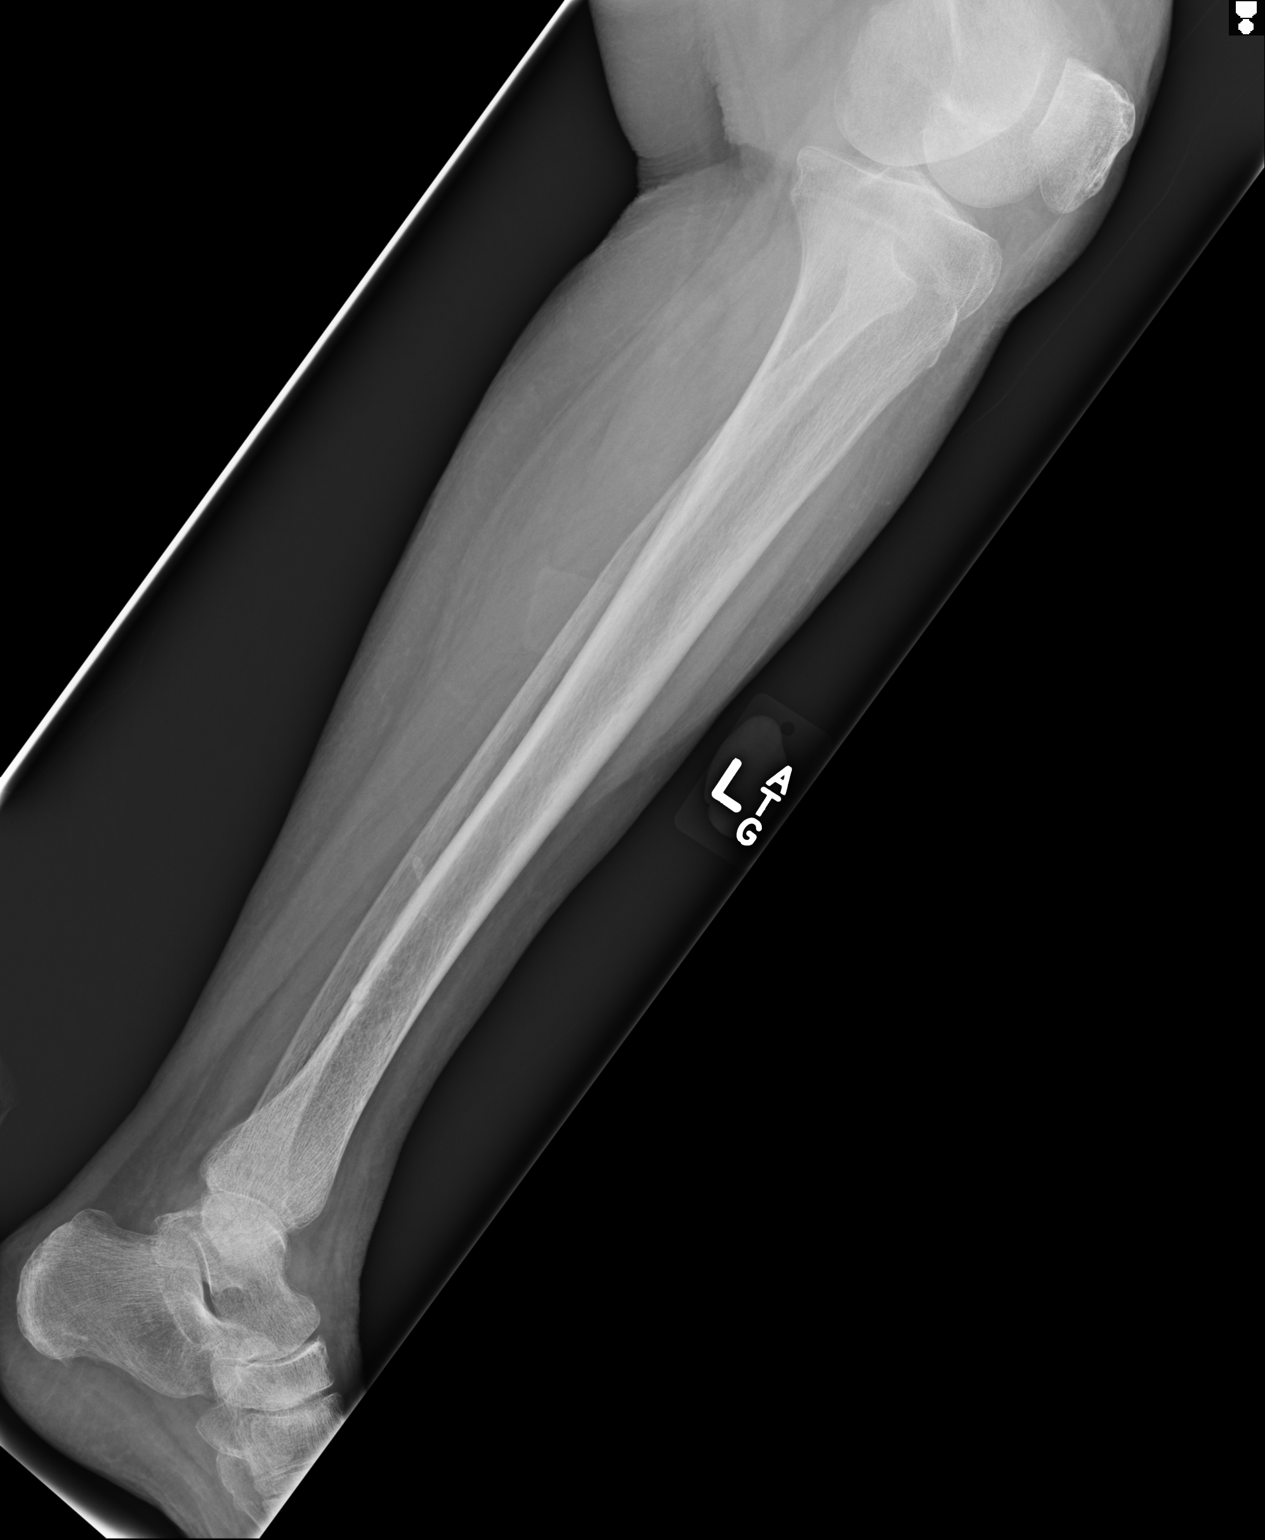

[2 of 2 positions shown; findings below may reference images not displayed]

PROCEDURE:     DXR - DXR TIBIA AND FIBULA LT (LOWER L  - September 01, 2012 [DATE]

RESULT:      AP and lateral views of the left tibia and fibula reveal the
bones to be osteopenic. The observed portions of the knee and ankle exhibit
no definite fracture or dislocation. There are phleboliths in the soft
tissues of the medial aspect of the distal third of the leg.
IMPRESSION: There is no definite acute abnormality of the left tibial
or fibula. If there are symptoms referable to the ankle, CT scanning would
be useful given the relatively suboptimal nature of the two-view series that
accompanies this study.

[REDACTED]

Addendum: There is subtle cortical disruption of the anterior cortex of the
distal fibula consistent with a nondisplaced fracture. This is demonstrated
best on the accompanying ankle. series.

## 2014-07-18 IMAGING — CR DG CHEST 1V PORT
1 series · 1 of 1 positions shown · non-contrast
Comparison: none

REASON FOR EXAM: Altered Mental Status
COMMENTS:

[ap]
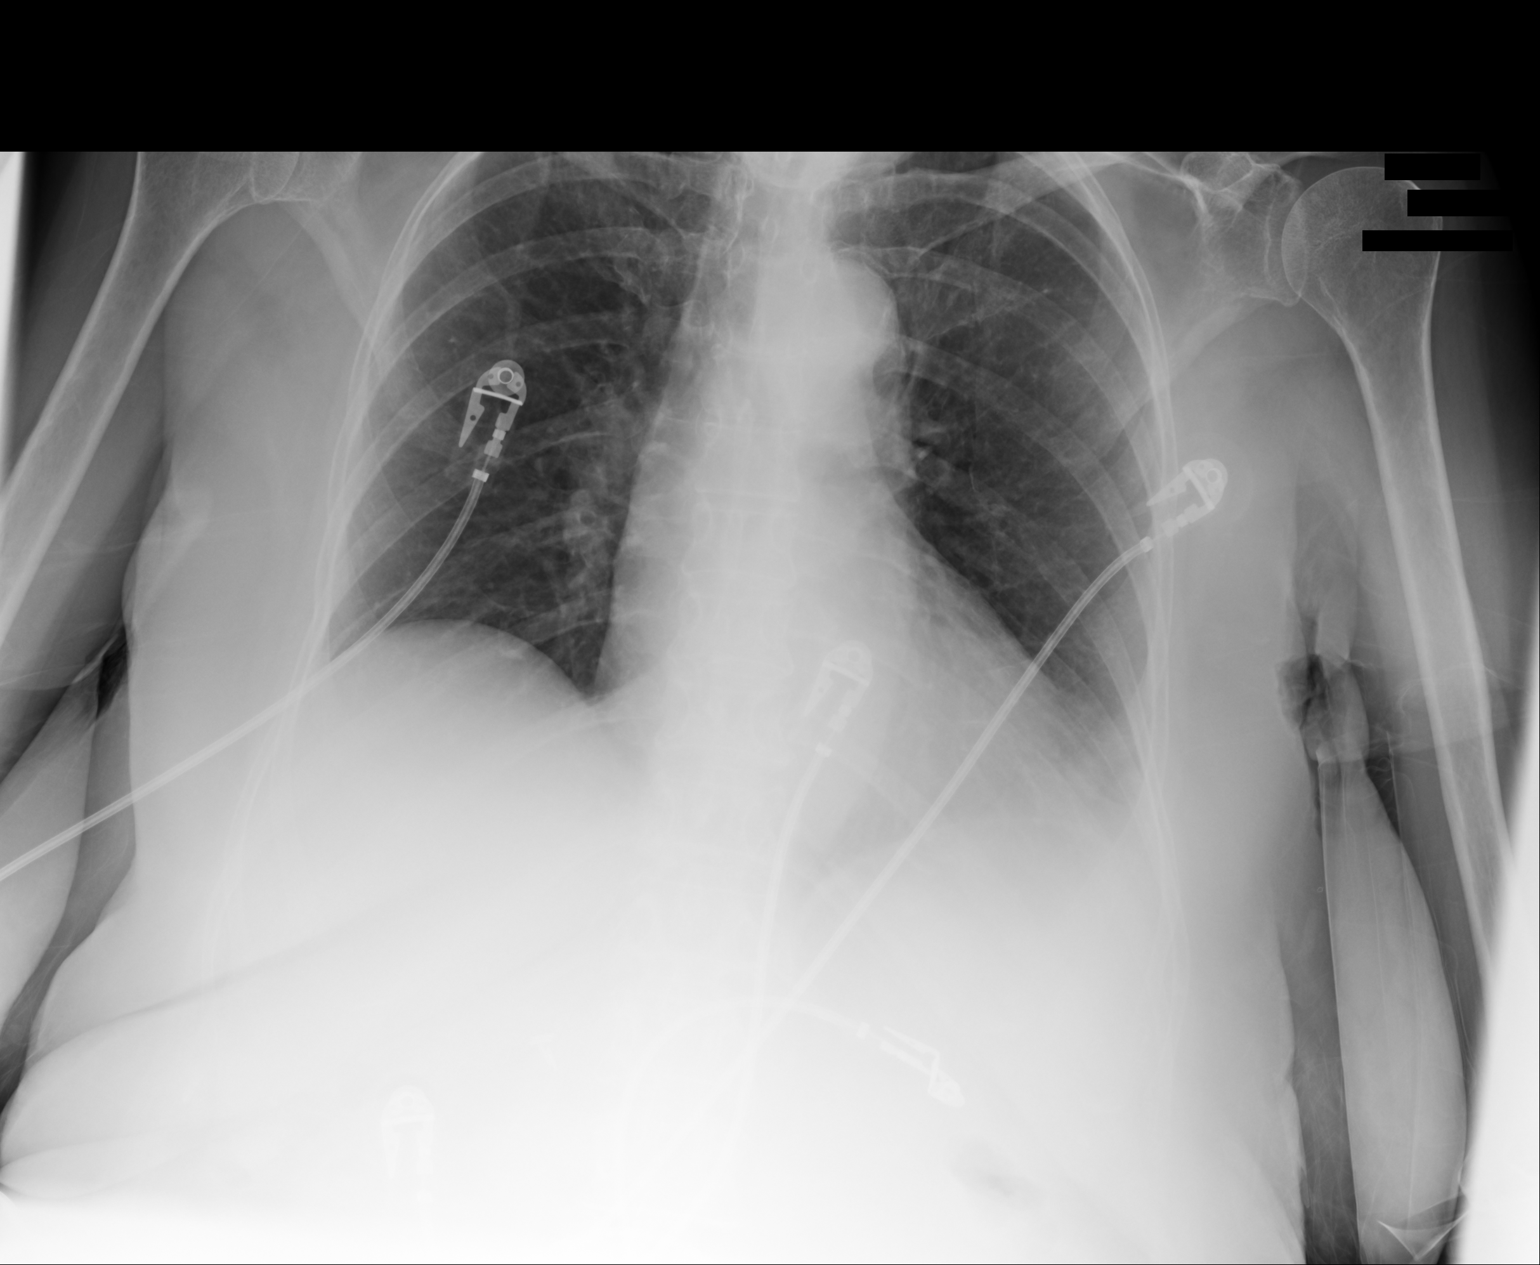

[1 of 1 positions shown; findings below may reference images not displayed]

PROCEDURE:     DXR - DXR PORTABLE CHEST SINGLE VIEW  - September 01, 2012 [DATE]

RESULT:     Comparison is made to a study July 20, 2011.

The lungs are adequately inflated. There is no focal infiltrate. The cardiac
silhouette is top normal in size. The pulmonary vascularity is not engorged.
The mediastinum is normal in width. There is tortuosity of the descending
thoracic aorta. The bony thorax exhibits no acute abnormality.
IMPRESSION: There is no evidence of acute cardiopulmonary abnormality.

[REDACTED]

## 2014-08-13 ENCOUNTER — Encounter
Admission: RE | Admit: 2014-08-13 | Discharge: 2014-08-13 | Disposition: A | Discharge status: Home / Self Care | Source: Ambulatory Visit | Attending: Internal Medicine | Admitting: Internal Medicine

## 2014-09-13 ENCOUNTER — Encounter
Admission: RE | Admit: 2014-09-13 | Discharge: 2014-09-13 | Disposition: A | Discharge status: Home / Self Care | Payer: Medicare Other | Source: Ambulatory Visit | Attending: Internal Medicine | Admitting: Internal Medicine

## 2014-10-14 ENCOUNTER — Encounter
Admission: RE | Admit: 2014-10-14 | Discharge: 2014-10-14 | Disposition: A | Discharge status: Home / Self Care | Source: Ambulatory Visit | Attending: Internal Medicine | Admitting: Internal Medicine

## 2014-11-13 ENCOUNTER — Encounter
Admission: RE | Admit: 2014-11-13 | Discharge: 2014-11-13 | Disposition: A | Discharge status: Home / Self Care | Payer: Medicare Other | Source: Ambulatory Visit | Attending: Internal Medicine | Admitting: Internal Medicine

## 2014-12-14 DEATH — deceased
